# Patient Record
Sex: Male | Born: 1985 | Race: Black or African American | Hispanic: No | Marital: Single
Health system: Southern US, Community
[De-identification: ages and names within clinical notes are randomized; demographics above are authoritative.]

---

## 2016-08-26 ENCOUNTER — Other Ambulatory Visit: Payer: Self-pay | Admitting: Nurse Practitioner

## 2016-08-26 DIAGNOSIS — M5020 Other cervical disc displacement, unspecified cervical region: Secondary | ICD-10-CM

## 2016-09-12 ENCOUNTER — Other Ambulatory Visit: Payer: Self-pay | Admitting: Neurosurgery

## 2016-09-12 ENCOUNTER — Ambulatory Visit
Admission: RE | Admit: 2016-09-12 | Discharge: 2016-09-12 | Disposition: A | Discharge status: Home / Self Care | Payer: Managed Care, Other (non HMO) | Source: Ambulatory Visit | Attending: Nurse Practitioner | Admitting: Nurse Practitioner

## 2016-09-12 DIAGNOSIS — M5020 Other cervical disc displacement, unspecified cervical region: Secondary | ICD-10-CM

## 2016-09-12 MED ORDER — IOPAMIDOL (ISOVUE-M 300) INJECTION 61%
1.0000 mL | Freq: Once | INTRAMUSCULAR | Status: AC | PRN
  Administered 2016-09-12: 1 mL via EPIDURAL

## 2016-09-12 MED ORDER — TRIAMCINOLONE ACETONIDE 40 MG/ML IJ SUSP (RADIOLOGY)
60.0000 mg | Freq: Once | INTRAMUSCULAR | Status: AC
  Administered 2016-09-12: 60 mg via EPIDURAL

## 2016-09-12 NOTE — Discharge Instructions (Signed)

## 2016-09-27 ENCOUNTER — Ambulatory Visit
Admission: RE | Admit: 2016-09-27 | Discharge: 2016-09-27 | Disposition: A | Discharge status: Home / Self Care | Payer: Managed Care, Other (non HMO) | Source: Ambulatory Visit | Attending: Neurosurgery | Admitting: Neurosurgery

## 2016-09-27 DIAGNOSIS — M5020 Other cervical disc displacement, unspecified cervical region: Secondary | ICD-10-CM

## 2016-09-27 MED ORDER — TRIAMCINOLONE ACETONIDE 40 MG/ML IJ SUSP (RADIOLOGY)
60.0000 mg | Freq: Once | INTRAMUSCULAR | Status: AC
  Administered 2016-09-27: 60 mg via EPIDURAL

## 2016-09-27 MED ORDER — IOPAMIDOL (ISOVUE-M 300) INJECTION 61%
1.0000 mL | Freq: Once | INTRAMUSCULAR | Status: AC | PRN
  Administered 2016-09-27: 1 mL via EPIDURAL

## 2016-10-29 ENCOUNTER — Other Ambulatory Visit: Payer: Self-pay | Admitting: Gastroenterology

## 2016-10-29 DIAGNOSIS — R197 Diarrhea, unspecified: Secondary | ICD-10-CM

## 2016-10-29 DIAGNOSIS — R1084 Generalized abdominal pain: Secondary | ICD-10-CM

## 2016-10-29 DIAGNOSIS — R634 Abnormal weight loss: Secondary | ICD-10-CM

## 2016-11-01 ENCOUNTER — Ambulatory Visit
Admission: RE | Admit: 2016-11-01 | Discharge: 2016-11-01 | Disposition: A | Discharge status: Home / Self Care | Payer: Managed Care, Other (non HMO) | Source: Ambulatory Visit | Attending: Gastroenterology | Admitting: Gastroenterology

## 2016-11-01 DIAGNOSIS — R1084 Generalized abdominal pain: Secondary | ICD-10-CM

## 2016-11-01 DIAGNOSIS — R197 Diarrhea, unspecified: Secondary | ICD-10-CM

## 2016-11-01 DIAGNOSIS — R634 Abnormal weight loss: Secondary | ICD-10-CM

## 2016-11-01 MED ORDER — IOPAMIDOL (ISOVUE-300) INJECTION 61%
100.0000 mL | Freq: Once | INTRAVENOUS | Status: AC | PRN
  Administered 2016-11-01: 100 mL via INTRAVENOUS

## 2018-12-20 IMAGING — XA DG INJECT/[PERSON_NAME] INC NEEDLE/CATH/PLC EPI/CERV/THOR W/IMG
2 series · 2 of 2 positions shown · non-contrast
Comparison: none

CLINICAL DATA: Cervical spondylosis without myelopathy. Adjacent
segment disease. C5-6 fusion. Focal disc extrusion at C6-7 on the
LEFT.

[Series 1: ortho adipose · 1 of 1 slices shown (1 of 2)]
[im 1/1]
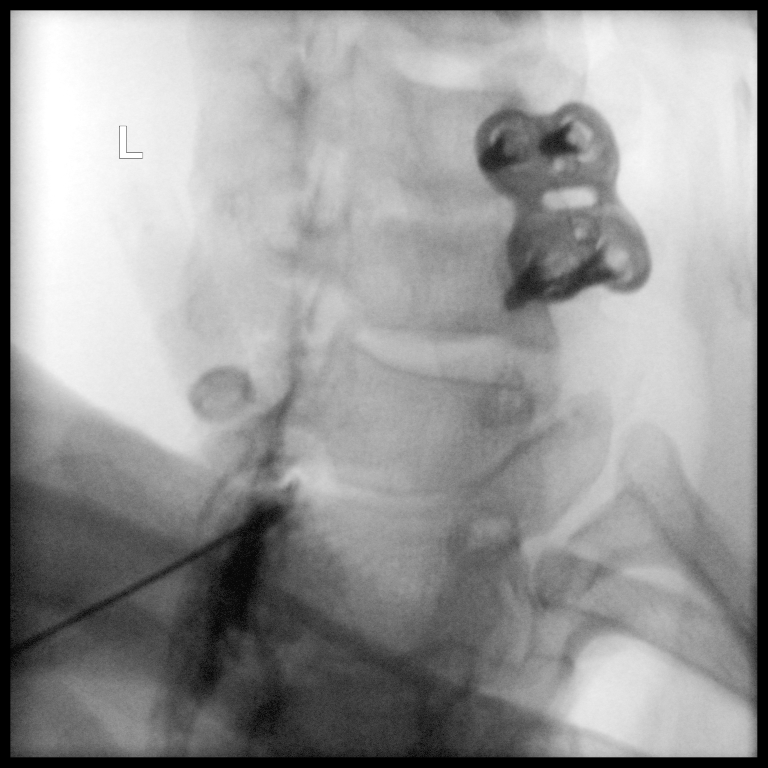

[Series 2: ortho adipose · 1 of 1 slices shown (2 of 2)]
[im 1/1]
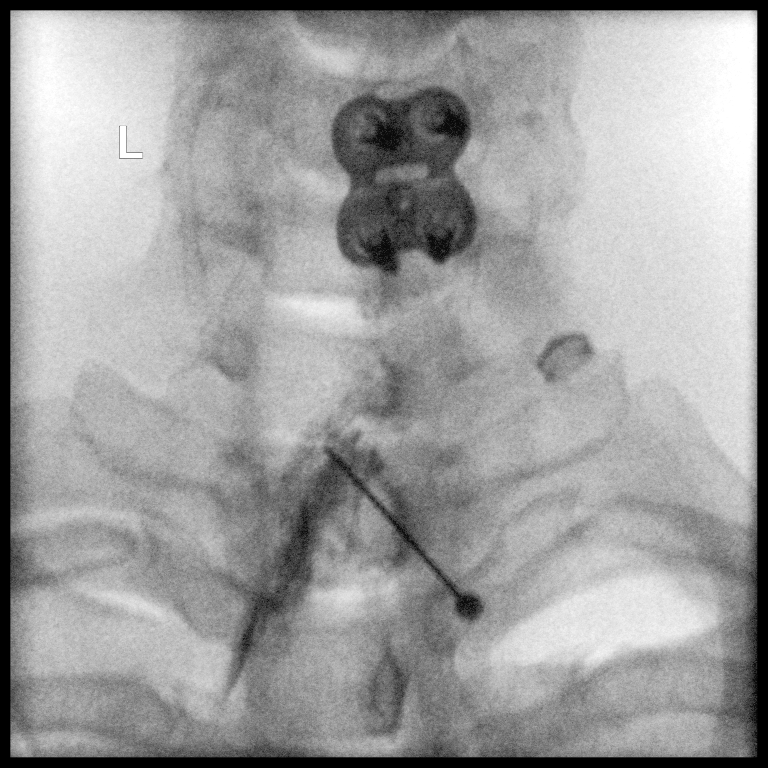

[2 of 2 positions shown; findings below may reference images not displayed]

FLUOROSCOPY TIME:  17 seconds corresponding to a Dose Area Product
of 8.8 ?Gy*m2

PROCEDURE:
CERVICAL EPIDURAL INJECTION

An interlaminar approach was performed on the LEFT at C7-T1 . A 20
gauge epidural needle was advanced using loss-of-resistance
technique.

DIAGNOSTIC EPIDURAL INJECTION

Injection of Isovue-M 300 initially showed dorsal extra-spinal
spread. Additional advancement with loss resistance technique shows
a good epidural pattern with spread above and below the level of
needle placement, primarily on the LEFT. No vascular opacification
is seen.

THERAPEUTIC EPIDURAL INJECTION

1.5 ml of Kenalog 40 mixed with 1 ml of 1% Lidocaine and 2 ml of
normal saline were then instilled. The procedure was well-tolerated,
and the patient was discharged thirty minutes following the
injection in good condition.
IMPRESSION: Technically successful first epidural injection on the LEFT at
C7-T1.
